# Patient Record
Sex: Female | Born: 1990 | Race: White | Hispanic: No | Marital: Single | Smoking: Former smoker
Health system: Southern US, Community
[De-identification: ages and names within clinical notes are randomized; demographics above are authoritative.]

## PROBLEM LIST (undated history)

## (undated) ENCOUNTER — Inpatient Hospital Stay (HOSPITAL_COMMUNITY): Payer: Self-pay

## (undated) DIAGNOSIS — Z789 Other specified health status: Secondary | ICD-10-CM

## (undated) HISTORY — PX: NO PAST SURGERIES: SHX2092

---
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
## 2009-02-10 ENCOUNTER — Ambulatory Visit (HOSPITAL_COMMUNITY): Admission: RE | Admit: 2009-02-10 | Discharge: 2009-02-10 | Payer: Self-pay | Admitting: Obstetrics

## 2009-06-02 ENCOUNTER — Ambulatory Visit (HOSPITAL_COMMUNITY): Admission: RE | Admit: 2009-06-02 | Discharge: 2009-06-02 | Payer: Self-pay | Admitting: Obstetrics

## 2009-06-23 ENCOUNTER — Inpatient Hospital Stay (HOSPITAL_COMMUNITY): Admission: AD | Admit: 2009-06-23 | Discharge: 2009-06-23 | Payer: Self-pay | Admitting: Obstetrics

## 2009-06-23 ENCOUNTER — Inpatient Hospital Stay (HOSPITAL_COMMUNITY): Admission: AD | Admit: 2009-06-23 | Discharge: 2009-06-25 | Payer: Self-pay | Admitting: Obstetrics

## 2009-06-24 ENCOUNTER — Encounter (INDEPENDENT_AMBULATORY_CARE_PROVIDER_SITE_OTHER): Payer: Self-pay | Admitting: Obstetrics

## 2010-05-15 LAB — CBC
HCT: 34.6 % — ABNORMAL LOW (ref 36.0–46.0)
HCT: 41.5 % (ref 36.0–46.0)
Hemoglobin: 11.8 g/dL — ABNORMAL LOW (ref 12.0–15.0)
Hemoglobin: 14 g/dL (ref 12.0–15.0)
MCHC: 34 g/dL (ref 30.0–36.0)
MCHC: 33.8 g/dL (ref 30.0–36.0)
MCV: 89.9 fL (ref 78.0–100.0)
MCV: 90.6 fL (ref 78.0–100.0)
Platelets: 123 10*3/uL — ABNORMAL LOW (ref 150–400)
Platelets: 127 10*3/uL — ABNORMAL LOW (ref 150–400)
RBC: 3.85 MIL/uL — ABNORMAL LOW (ref 3.87–5.11)
RBC: 4.58 MIL/uL (ref 3.87–5.11)
RDW: 14.5 % (ref 11.5–15.5)
RDW: 13.9 % (ref 11.5–15.5)
WBC: 10 10*3/uL (ref 4.0–10.5)
WBC: 10.5 10*3/uL (ref 4.0–10.5)

## 2010-05-15 LAB — RPR: RPR Ser Ql: NONREACTIVE

## 2010-06-23 ENCOUNTER — Emergency Department (HOSPITAL_BASED_OUTPATIENT_CLINIC_OR_DEPARTMENT_OTHER)
Admission: EM | Admit: 2010-06-23 | Discharge: 2010-06-23 | Disposition: A | Discharge status: Home / Self Care | Payer: Self-pay | Attending: Emergency Medicine | Admitting: Emergency Medicine

## 2010-06-23 DIAGNOSIS — J029 Acute pharyngitis, unspecified: Secondary | ICD-10-CM | POA: Insufficient documentation

## 2010-06-23 LAB — RAPID STREP SCREEN (MED CTR MEBANE ONLY): Streptococcus, Group A Screen (Direct): NEGATIVE

## 2012-03-07 ENCOUNTER — Emergency Department (HOSPITAL_COMMUNITY)
Admission: EM | Admit: 2012-03-07 | Discharge: 2012-03-07 | Disposition: A | Discharge status: Home / Self Care | Payer: Self-pay | Attending: Emergency Medicine | Admitting: Emergency Medicine

## 2012-03-07 ENCOUNTER — Encounter (HOSPITAL_COMMUNITY): Payer: Self-pay | Admitting: Emergency Medicine

## 2012-03-07 DIAGNOSIS — J3489 Other specified disorders of nose and nasal sinuses: Secondary | ICD-10-CM | POA: Insufficient documentation

## 2012-03-07 DIAGNOSIS — Z791 Long term (current) use of non-steroidal anti-inflammatories (NSAID): Secondary | ICD-10-CM | POA: Insufficient documentation

## 2012-03-07 DIAGNOSIS — H65499 Other chronic nonsuppurative otitis media, unspecified ear: Secondary | ICD-10-CM | POA: Insufficient documentation

## 2012-03-07 DIAGNOSIS — F17200 Nicotine dependence, unspecified, uncomplicated: Secondary | ICD-10-CM | POA: Insufficient documentation

## 2012-03-07 DIAGNOSIS — H6590 Unspecified nonsuppurative otitis media, unspecified ear: Secondary | ICD-10-CM

## 2012-03-07 MED ORDER — CETIRIZINE HCL 10 MG PO TABS: 10.0000 mg | ORAL_TABLET | Freq: Every day | ORAL | Status: DC

## 2012-03-07 MED ORDER — NAPROXEN 500 MG PO TABS: 500.0000 mg | ORAL_TABLET | Freq: Two times a day (BID) | ORAL | Status: DC

## 2012-03-07 MED ORDER — KETOROLAC TROMETHAMINE 60 MG/2ML IM SOLN
60.0000 mg | Freq: Once | INTRAMUSCULAR | Status: AC
  Administered 2012-03-07: 60 mg via INTRAMUSCULAR
  Filled 2012-03-07: qty 2

## 2012-03-07 NOTE — ED Notes (Addendum)
PT. REPORTS GENERALIZED BODY ACHES/BACK PAIN WITH CHILLS , OCCASIONAL PRODUCTIVE COUGH AND BILATERAL EAR ACHE FOR SEVERAL DAYS .

## 2012-03-07 NOTE — ED Provider Notes (Signed)
History     CSN: [id number]  Arrival date & time 03/07/12  0040   First MD Initiated Contact with Patient 03/07/12 0114      Chief Complaint  Patient presents with  . Generalized Body Aches    (Consider location/radiation/quality/duration/timing/severity/associated sxs/prior treatment) HPI Comments: 2 months of difficulty hearing - stuffy ears.    CC of body aches, has been present for 4 days, has been intermittent - this evening the sx came back, diffuse body aches - temp of 101.?  Endorses coughing and SOB - worse with moving around.  (waitress for work).  Has some nasal congestion.  No sinus pressure.  Deneis diarrhea, dysuria, swelling or rashes.  N/v last 2 days.  Sx are persistent over time.  Nothing makes better or worse   The history is provided by the patient and a relative.    History reviewed. No pertinent past medical history.  History reviewed. No pertinent past surgical history.  No family history on file.  History  Substance Use Topics  . Smoking status: Current Every Day Smoker  . Smokeless tobacco: Not on file  . Alcohol Use: Yes    OB History    Grav Para Term Preterm Abortions TAB SAB Ect Mult Living                  Review of Systems  All other systems reviewed and are negative.    Allergies  Review of patient's allergies indicates no known allergies.  Home Medications   Current Outpatient Rx  Name  Route  Sig  Dispense  Refill  . DAYQUIL MULTI-SYMPTOM PO   Oral   Take 10 mLs by mouth every 6 (six) hours as needed. For cold symptoms         . CETIRIZINE HCL 10 MG PO TABS   Oral   Take 1 tablet (10 mg total) by mouth daily.   30 tablet   1   . NAPROXEN 500 MG PO TABS   Oral   Take 1 tablet (500 mg total) by mouth 2 (two) times daily with a meal.   30 tablet   0     BP 102/63  Pulse 99  Temp 98.9 F (37.2 C) (Oral)  Resp 14  SpO2 99%  LMP 02/28/2012  Physical Exam  Nursing note and vitals reviewed. Constitutional:  She appears well-developed and well-nourished. No distress.  HENT:  Head: Normocephalic and atraumatic.  Mouth/Throat: Oropharynx is clear and moist. No oropharyngeal exudate.       Bilateral effusions - clear, no erythema, nasal congestion present, OP normal, no pharyngeal erythema.    Eyes: Conjunctivae normal and EOM are normal. Pupils are equal, round, and reactive to light. Right eye exhibits no discharge. Left eye exhibits no discharge. No scleral icterus.  Neck: Normal range of motion. Neck supple. No JVD present. No thyromegaly present.  Cardiovascular: Normal rate, regular rhythm, normal heart sounds and intact distal pulses.  Exam reveals no gallop and no friction rub.   No murmur heard. Pulmonary/Chest: Effort normal and breath sounds normal. No respiratory distress. She has no wheezes. She has no rales.  Abdominal: Soft. Bowel sounds are normal. She exhibits no distension and no mass. There is no tenderness.  Musculoskeletal: Normal range of motion. She exhibits no edema and no tenderness.  Lymphadenopathy:    She has no cervical adenopathy.  Neurological: She is alert. Coordination normal.  Skin: Skin is warm and dry. No rash noted. No erythema.  Psychiatric: She has a normal mood and affect. Her behavior is normal.    ED Course  Procedures (including critical care time)  Labs Reviewed - No data to display No results found.   1. Influenza-like illness   2. Middle ear effusion       MDM  Well appearing, likely URI / Flu like symptoms.  Toradol, home with zyrtec and mobic.  Tolerating fluids without any difficulty, patient appears stable for discharge      Vida Roller, MD 03/07/12 [phone]

## 2012-03-11 ENCOUNTER — Encounter (HOSPITAL_COMMUNITY): Payer: Self-pay | Admitting: *Deleted

## 2012-03-11 ENCOUNTER — Emergency Department (HOSPITAL_COMMUNITY)
Admission: EM | Admit: 2012-03-11 | Discharge: 2012-03-12 | Disposition: A | Discharge status: Home / Self Care | Payer: Self-pay | Attending: Emergency Medicine | Admitting: Emergency Medicine

## 2012-03-11 DIAGNOSIS — F17200 Nicotine dependence, unspecified, uncomplicated: Secondary | ICD-10-CM | POA: Insufficient documentation

## 2012-03-11 DIAGNOSIS — Z202 Contact with and (suspected) exposure to infections with a predominantly sexual mode of transmission: Secondary | ICD-10-CM | POA: Insufficient documentation

## 2012-03-11 DIAGNOSIS — H9190 Unspecified hearing loss, unspecified ear: Secondary | ICD-10-CM | POA: Insufficient documentation

## 2012-03-11 DIAGNOSIS — H9209 Otalgia, unspecified ear: Secondary | ICD-10-CM | POA: Insufficient documentation

## 2012-03-11 DIAGNOSIS — Z79899 Other long term (current) drug therapy: Secondary | ICD-10-CM | POA: Insufficient documentation

## 2012-03-11 DIAGNOSIS — H6690 Otitis media, unspecified, unspecified ear: Secondary | ICD-10-CM | POA: Insufficient documentation

## 2012-03-11 DIAGNOSIS — J3489 Other specified disorders of nose and nasal sinuses: Secondary | ICD-10-CM | POA: Insufficient documentation

## 2012-03-11 DIAGNOSIS — J329 Chronic sinusitis, unspecified: Secondary | ICD-10-CM | POA: Insufficient documentation

## 2012-03-11 NOTE — ED Notes (Signed)
Pt states her boyfriend was dx with clamydia on 02/26/12.  Wants to be treated for that.  Also, c/o ear pain.

## 2012-03-12 LAB — URINALYSIS, ROUTINE W REFLEX MICROSCOPIC
Bilirubin Urine: NEGATIVE
Ketones, ur: 15 mg/dL — AB
Nitrite: NEGATIVE
pH: 6 (ref 5.0–8.0)

## 2012-03-12 LAB — POCT PREGNANCY, URINE: Preg Test, Ur: NEGATIVE

## 2012-03-12 MED ORDER — AZITHROMYCIN 250 MG PO TABS
1000.0000 mg | ORAL_TABLET | Freq: Once | ORAL | Status: AC
  Administered 2012-03-12: 1000 mg via ORAL
  Filled 2012-03-12: qty 4

## 2012-03-12 MED ORDER — METRONIDAZOLE 500 MG PO TABS
2000.0000 mg | ORAL_TABLET | Freq: Once | ORAL | Status: AC
  Administered 2012-03-12: 2000 mg via ORAL
  Filled 2012-03-12: qty 4

## 2012-03-12 MED ORDER — LIDOCAINE HCL (PF) 1 % IJ SOLN
INTRAMUSCULAR | Status: AC
  Filled 2012-03-12: qty 5

## 2012-03-12 MED ORDER — CEFTRIAXONE SODIUM 250 MG IJ SOLR
250.0000 mg | Freq: Once | INTRAMUSCULAR | Status: AC
  Administered 2012-03-12: 250 mg via INTRAMUSCULAR
  Filled 2012-03-12: qty 250

## 2012-03-12 MED ORDER — AMOXICILLIN-POT CLAVULANATE 875-125 MG PO TABS: 1.0000 | ORAL_TABLET | Freq: Two times a day (BID) | ORAL | Status: DC

## 2012-03-12 NOTE — ED Provider Notes (Signed)
Medical screening examination/treatment/procedure(s) were performed by non-physician practitioner and as supervising physician I was immediately available for consultation/collaboration.  Markise Haymer K Clerence Gubser-Rasch, MD 03/12/12 0101 

## 2012-03-12 NOTE — ED Provider Notes (Signed)
History     CSN: [id number]  Arrival date & time 03/11/12  2304   First MD Initiated Contact with Patient 03/12/12 0000      Chief Complaint  Patient presents with  . Exposure to STD  . Otalgia    (Consider location/radiation/quality/duration/timing/severity/associated sxs/prior treatment) HPI Comments: Patient's last menstrual period was 02/28/2012.   Patient is a 22 y.o. female presenting with STD exposure and ear pain. The history is provided by the patient.  Exposure to STD This is a new problem. The current episode started 1 to 4 weeks ago. Episode frequency: denies pain or symptoms. The problem has been unchanged. Pertinent negatives include no abdominal pain, anorexia, coughing, headaches, neck pain, rash or sore throat. Nothing aggravates the symptoms. She has tried nothing for the symptoms.  Otalgia This is a chronic (2 months) problem. There is pain in the right ear. The problem occurs constantly. The problem has been gradually worsening. There has been no fever. The pain is at a severity of 5/10. The pain is moderate. Associated symptoms include hearing loss and rhinorrhea. Pertinent negatives include no ear discharge, no headaches, no sore throat, no abdominal pain, no diarrhea, no neck pain, no cough and no rash.    History reviewed. No pertinent past medical history.  History reviewed. No pertinent past surgical history.  History reviewed. No pertinent family history.  History  Substance Use Topics  . Smoking status: Current Every Day Smoker  . Smokeless tobacco: Not on file  . Alcohol Use: Yes    OB History    Grav Para Term Preterm Abortions TAB SAB Ect Mult Living                  Review of Systems  HENT: Positive for hearing loss, ear pain and rhinorrhea. Negative for sore throat, neck pain and ear discharge.   Respiratory: Negative for cough.   Gastrointestinal: Negative for abdominal pain, diarrhea and anorexia.  Genitourinary: Negative for dysuria,  urgency, frequency, flank pain, decreased urine volume, vaginal bleeding, vaginal discharge, difficulty urinating, genital sores, vaginal pain and dyspareunia.  Skin: Negative for rash.  Neurological: Negative for headaches.  All other systems reviewed and are negative.    Allergies  Review of patient's allergies indicates no known allergies.  Home Medications   Current Outpatient Rx  Name  Route  Sig  Dispense  Refill  . CETIRIZINE HCL 10 MG PO TABS   Oral   Take 1 tablet (10 mg total) by mouth daily.   30 tablet   1   . NAPROXEN 500 MG PO TABS   Oral   Take 1 tablet (500 mg total) by mouth 2 (two) times daily with a meal.   30 tablet   0   . DAYQUIL MULTI-SYMPTOM PO   Oral   Take 10 mLs by mouth every 6 (six) hours as needed. For cold symptoms           BP 121/65  Pulse 72  Temp 97.9 F (36.6 C) (Oral)  Resp 16  SpO2 99%  LMP 02/28/2012  Physical Exam  Constitutional: She is oriented to person, place, and time. She appears well-developed and well-nourished. No distress.  HENT:  Head: Normocephalic and atraumatic.  Mouth/Throat: Oropharynx is clear and moist. No oropharyngeal exudate.       Left ear normal. Right TM bulging, normal external canal. No tragal or mastoid ttp. Nasal congestion and rhinorrhea present. Normal oropharynx w MMM.   Eyes: Conjunctivae normal and  EOM are normal. Pupils are equal, round, and reactive to light. No scleral icterus.  Neck: Normal range of motion. Neck supple. No tracheal deviation present. No thyromegaly present.  Cardiovascular: Normal rate, regular rhythm, normal heart sounds and intact distal pulses.   Pulmonary/Chest: Effort normal and breath sounds normal. No stridor. No respiratory distress. She has no wheezes.  Abdominal: Soft.       Soft non tender abdomen w normal normal sounds  Genitourinary:       Exam deferred   Musculoskeletal: Normal range of motion. She exhibits no edema and no tenderness.  Neurological: She  is alert and oriented to person, place, and time. Coordination normal.  Skin: Skin is warm and dry. No rash noted. She is not diaphoretic. No erythema. No pallor.  Psychiatric: She has a normal mood and affect. Her behavior is normal.    ED Course  Procedures (including critical care time)   Labs Reviewed  URINALYSIS, ROUTINE W REFLEX MICROSCOPIC   No results found.   No diagnosis found.    MDM  Otitis media & STD encounter  Patient to be discharged with instructions to follow up with OBGYN for further STD testing, pelvic exam deferred. Discussed importance of using protection when sexually active. Pt understands that they have been treated prophylacticly with azithromycin and rocephin due to encounter with STD + sexual partner.  Pt not concerning for PID because hemodynamically stable, no abdominal pain on exam or history of dyspareunia or vaginal dc.  Pt has also been treated with flagyl for Bacterial Vaginosis. Pt has been advised to not drink alcohol while on this medication. IN addition she reported that she has had otalgia and sinus infection x 2 months. Pt will be given augmentin x 10 day and advised to f-u with ENT if symptoms persist.          Jaci Carrel, PA-C 03/12/12 [phone]

## 2012-03-12 NOTE — ED Notes (Signed)
Pt given discharge paperwork; pt verbalized understanding of discharge and prescriptions; no additional questions by pt; e-signature obtained; VSS;

## 2015-02-26 NOTE — L&D Delivery Note (Signed)
Delivery Note At 5:39 AM a viable female was delivered via Vaginal, Spontaneous Delivery (Presentation: ; Occiput Anterior).  APGAR: , ; weight  .   Placenta status: , .  Cord: 3 vessels with the following complications: None.  Cord pH: not done  Anesthesia: Epidural  Episiotomy: None Lacerations: None Suture Repair: na Est. Blood Loss (mL): 250  Mom to postpartum.  Baby to Couplet care / Skin to Skin.  MARSHALL,BERNARD A 08/13/2015, 5:54 AM

## 2015-03-02 ENCOUNTER — Emergency Department (HOSPITAL_COMMUNITY)
Admission: EM | Admit: 2015-03-02 | Discharge: 2015-03-03 | Disposition: A | Discharge status: Home / Self Care | Payer: Medicaid Other | Attending: Emergency Medicine | Admitting: Emergency Medicine

## 2015-03-02 ENCOUNTER — Encounter (HOSPITAL_COMMUNITY): Payer: Self-pay | Admitting: *Deleted

## 2015-03-02 DIAGNOSIS — Z880 Allergy status to penicillin: Secondary | ICD-10-CM | POA: Diagnosis not present

## 2015-03-02 DIAGNOSIS — F17200 Nicotine dependence, unspecified, uncomplicated: Secondary | ICD-10-CM | POA: Insufficient documentation

## 2015-03-02 DIAGNOSIS — Z79899 Other long term (current) drug therapy: Secondary | ICD-10-CM | POA: Insufficient documentation

## 2015-03-02 DIAGNOSIS — Z792 Long term (current) use of antibiotics: Secondary | ICD-10-CM | POA: Insufficient documentation

## 2015-03-02 DIAGNOSIS — T5994XA Toxic effect of unspecified gases, fumes and vapors, undetermined, initial encounter: Secondary | ICD-10-CM | POA: Insufficient documentation

## 2015-03-02 DIAGNOSIS — Y990 Civilian activity done for income or pay: Secondary | ICD-10-CM | POA: Diagnosis not present

## 2015-03-02 DIAGNOSIS — X58XXXA Exposure to other specified factors, initial encounter: Secondary | ICD-10-CM | POA: Diagnosis not present

## 2015-03-02 DIAGNOSIS — Z3A16 16 weeks gestation of pregnancy: Secondary | ICD-10-CM | POA: Diagnosis not present

## 2015-03-02 DIAGNOSIS — O219 Vomiting of pregnancy, unspecified: Secondary | ICD-10-CM

## 2015-03-02 DIAGNOSIS — Y9289 Other specified places as the place of occurrence of the external cause: Secondary | ICD-10-CM | POA: Diagnosis not present

## 2015-03-02 DIAGNOSIS — Y9389 Activity, other specified: Secondary | ICD-10-CM | POA: Diagnosis not present

## 2015-03-02 DIAGNOSIS — O210 Mild hyperemesis gravidarum: Secondary | ICD-10-CM | POA: Insufficient documentation

## 2015-03-02 DIAGNOSIS — O9A212 Injury, poisoning and certain other consequences of external causes complicating pregnancy, second trimester: Secondary | ICD-10-CM | POA: Insufficient documentation

## 2015-03-02 DIAGNOSIS — O99332 Smoking (tobacco) complicating pregnancy, second trimester: Secondary | ICD-10-CM | POA: Insufficient documentation

## 2015-03-02 LAB — CBC
HEMATOCRIT: 39.9 % (ref 36.0–46.0)
Hemoglobin: 13.3 g/dL (ref 12.0–15.0)
MCH: 29.2 pg (ref 26.0–34.0)
MCHC: 33.3 g/dL (ref 30.0–36.0)
MCV: 87.7 fL (ref 78.0–100.0)
PLATELETS: 154 10*3/uL (ref 150–400)
RBC: 4.55 MIL/uL (ref 3.87–5.11)
RDW: 13.2 % (ref 11.5–15.5)
WBC: 10.2 10*3/uL (ref 4.0–10.5)

## 2015-03-02 LAB — COMPREHENSIVE METABOLIC PANEL
ALBUMIN: 3.5 g/dL (ref 3.5–5.0)
ALT: 17 U/L (ref 14–54)
AST: 18 U/L (ref 15–41)
Alkaline Phosphatase: 51 U/L (ref 38–126)
Anion gap: 11 (ref 5–15)
BILIRUBIN TOTAL: 0.3 mg/dL (ref 0.3–1.2)
BUN: 12 mg/dL (ref 6–20)
CO2: 23 mmol/L (ref 22–32)
CREATININE: 0.5 mg/dL (ref 0.44–1.00)
Calcium: 9.1 mg/dL (ref 8.9–10.3)
Chloride: 105 mmol/L (ref 101–111)
GFR calc Af Amer: >60 mL/min (ref >60)
GLUCOSE: 88 mg/dL (ref 65–99)
POTASSIUM: 3.7 mmol/L (ref 3.5–5.1)
Sodium: 139 mmol/L (ref 135–145)
TOTAL PROTEIN: 6.8 g/dL (ref 6.5–8.1)

## 2015-03-02 LAB — URINALYSIS, ROUTINE W REFLEX MICROSCOPIC
BILIRUBIN URINE: NEGATIVE
GLUCOSE, UA: NEGATIVE mg/dL
Hgb urine dipstick: NEGATIVE
LEUKOCYTES UA: NEGATIVE
NITRITE: NEGATIVE
PH: 6 (ref 5.0–8.0)
PROTEIN: NEGATIVE mg/dL
Specific Gravity, Urine: 1.028 (ref 1.005–1.030)

## 2015-03-02 LAB — LIPASE, BLOOD: Lipase: 19 U/L (ref 11–51)

## 2015-03-02 MED ORDER — ONDANSETRON 4 MG PO TBDP
4.0000 mg | ORAL_TABLET | Freq: Once | ORAL | Status: AC | PRN
  Administered 2015-03-02: 4 mg via ORAL

## 2015-03-02 MED ORDER — ONDANSETRON HCL 4 MG/2ML IJ SOLN: 4.0000 mg | Freq: Once | INTRAMUSCULAR | Status: DC

## 2015-03-02 MED ORDER — DOXYLAMINE-PYRIDOXINE 10-10 MG PO TBEC: DELAYED_RELEASE_TABLET | ORAL | Status: DC

## 2015-03-02 MED ORDER — SODIUM CHLORIDE 0.9 % IV BOLUS (SEPSIS): 1000.0000 mL | Freq: Once | INTRAVENOUS | Status: DC

## 2015-03-02 MED ORDER — ONDANSETRON 4 MG PO TBDP
ORAL_TABLET | ORAL | Status: AC
  Filled 2015-03-02: qty 1

## 2015-03-02 NOTE — ED Notes (Signed)
Pt states she is [redacted] weeks pregnant and has been vomiting for the past 5 hours.

## 2015-03-02 NOTE — ED Notes (Signed)
Pt states that she took amoxicillin today and then began vomiting. States she has been taking it since new years eve.

## 2015-03-02 NOTE — ED Provider Notes (Signed)
CSN: [id number]     Arrival date & time 03/02/15  2021 History   First MD Initiated Contact with Patient 03/02/15 2259     Chief Complaint  Patient presents with  . Emesis    HPI  [name] is an 25 y.o. G1P0 16-week pregnant female who presents to the ED for evaluation of nausea and vomiting. She states she has felt nauseated with several episodes of NBNB emesis today. She states that earlier in her pregnancy she had issues with nausea and vomiting but that had resolved. 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 She states the only other new exposure she can think of is that she has been taking amoxicillin for an ear infection for the past five days. Pt was given zofran odt in triage and now reports feeling much better. Denies any nausea currently. She is able to tolerate PO. Denies abdominal pain, vaginal discharge, vaginal bleeding. Denies fever, chills, diarrhea. She states her left ear does still hurt a little bit and she feels like she can't hear as well as normal. Last took amoxicillin earlier this morning. Pt states she last saw OB yesterday and had a normal exam.    History reviewed. No pertinent past medical history. History reviewed. No pertinent past surgical history. No family history on file. Social History  Substance Use Topics  . Smoking status: Current Every Day Smoker  . Smokeless tobacco: None  . Alcohol Use: Yes   OB History    Gravida Para Term Preterm AB TAB SAB Ectopic Multiple Living   1              Review of Systems  All other systems reviewed and are negative.     Allergies  Penicillins  Home Medications   Prior to Admission medications   Medication Sig Start Date End Date Taking? Authorizing Provider  amoxicillin (AMOXIL) 875 MG tablet Take 875 mg by mouth 2 (two) times daily.   Yes Historical Provider, MD  BUPRENORPHINE HCL SL Place 20 mg under the  tongue daily.   Yes Historical Provider, MD  Prenatal Vit-Fe Fumarate-FA (PRENATAL MULTIVITAMIN) TABS tablet Take 2 tablets by mouth daily at 12 noon.   Yes Historical Provider, MD  promethazine (PHENERGAN) 25 MG tablet Take 25 mg by mouth every 6 (six) hours as needed for nausea or vomiting.   Yes Historical Provider, MD   BP 121/88 mmHg  Pulse 95  Temp(Src) 97.5 F (36.4 C) (Oral)  Resp 18  SpO2 100% Physical Exam  Constitutional: She is oriented to person, place, and time. No distress.  HENT:  Right Ear: External ear normal.  Left Ear: External ear normal.  Nose: Nose normal.  Mouth/Throat: Oropharynx is clear and moist. No oropharyngeal exudate.  L TM mildly erythematous around the border. No fluid visualized. EAC unremarkable. R TM unremarkable.  Eyes: Conjunctivae and EOM are normal. Pupils are equal, round, and reactive to light.  Neck: Normal range of motion. Neck supple.  Cardiovascular: Normal rate, regular rhythm, normal heart sounds and intact distal pulses.   Pulmonary/Chest: Effort normal and breath sounds normal. No respiratory distress. She exhibits no tenderness.  Abdominal: Soft. Bowel sounds are normal. She exhibits no distension. There is no tenderness. There is no rebound and no guarding.  Musculoskeletal: She exhibits no edema.  Neurological: She is alert and oriented to person, place, and time. No cranial nerve deficit.  Skin: Skin is warm and dry. She is not diaphoretic. No pallor.  Psychiatric: She has a normal mood and affect.  Nursing note and vitals reviewed.     ED Course  Procedures (including critical care time) Labs Review Labs Reviewed  URINALYSIS, ROUTINE W REFLEX MICROSCOPIC (NOT AT [facility]) - Abnormal; Notable for the following:    APPearance CLOUDY (*)    Ketones, ur >80 (*)    All other components within normal limits  LIPASE, BLOOD  COMPREHENSIVE METABOLIC PANEL  CBC    Imaging Review No results found. I have personally reviewed and  evaluated these images and lab results as part of my medical decision-making.   EKG Interpretation None      MDM   Final diagnoses:  Nausea and vomiting in pregnancy prior to [redacted] weeks gestation    Pt is a 16-week pregnant G1P0 female who presents with n/v that started today after exposure to a strong-smelling chemical at work. She is also taking amoxicillin for an ear infection. Her labs are normal. She was given Zofran ODT in triage with relief of her nausea. She is able to tolerate PO. She has had no abdominal pain/cramping, vaginal bleeding, or vaginal discharge. I offered 1L NS bolus but pt declines and would like to go home. Her exam is otherwise unremarkable. VSS. I will give her rx for diclegis (she has phenergan at home from [facility] but states it makes her drowsy) with instructions to f/u with OB outpatient. ER return precautions given. Pt verbalized her understanding.     Carlene Coria, PA-C 03/02/15 2350  ADDENDUM:  Pt also instructed to d/c amoxicillin as her ear infection looks to be resolved, and it is unclear if amoxicillin was contributing to her N/V earlier today.  Carlene Coria, PA-C 03/02/15 2351  Laurence Spates, MD 03/03/15 [phone]

## 2015-03-02 NOTE — Discharge Instructions (Signed)
You were seen in the emergency room today for evaluation of nausea and vomiting. Your symptoms resolved here with a dose of Zofran. Your labs and exam were normal. I will give you a prescription for Diclegis, a nausea medicine that is show to be safe to take during pregnancy. Please call your OB doctor tomorrow to schedule a follow-up appointment. Return to the ER for new or worsening symptoms.

## 2015-03-28 ENCOUNTER — Emergency Department (HOSPITAL_COMMUNITY)
Admission: EM | Admit: 2015-03-28 | Discharge: 2015-03-28 | Disposition: A | Discharge status: Home / Self Care | Payer: Medicaid Other | Attending: Emergency Medicine | Admitting: Emergency Medicine

## 2015-03-28 ENCOUNTER — Encounter (HOSPITAL_COMMUNITY): Payer: Self-pay | Admitting: Emergency Medicine

## 2015-03-28 DIAGNOSIS — O209 Hemorrhage in early pregnancy, unspecified: Secondary | ICD-10-CM | POA: Insufficient documentation

## 2015-03-28 DIAGNOSIS — F17210 Nicotine dependence, cigarettes, uncomplicated: Secondary | ICD-10-CM | POA: Insufficient documentation

## 2015-03-28 DIAGNOSIS — Z79899 Other long term (current) drug therapy: Secondary | ICD-10-CM | POA: Diagnosis not present

## 2015-03-28 DIAGNOSIS — Z792 Long term (current) use of antibiotics: Secondary | ICD-10-CM | POA: Diagnosis not present

## 2015-03-28 DIAGNOSIS — R197 Diarrhea, unspecified: Secondary | ICD-10-CM

## 2015-03-28 DIAGNOSIS — R110 Nausea: Secondary | ICD-10-CM

## 2015-03-28 DIAGNOSIS — O9989 Other specified diseases and conditions complicating pregnancy, childbirth and the puerperium: Secondary | ICD-10-CM | POA: Diagnosis not present

## 2015-03-28 DIAGNOSIS — Z3A20 20 weeks gestation of pregnancy: Secondary | ICD-10-CM | POA: Diagnosis not present

## 2015-03-28 DIAGNOSIS — O99332 Smoking (tobacco) complicating pregnancy, second trimester: Secondary | ICD-10-CM | POA: Insufficient documentation

## 2015-03-28 DIAGNOSIS — Z880 Allergy status to penicillin: Secondary | ICD-10-CM | POA: Insufficient documentation

## 2015-03-28 LAB — CBC WITH DIFFERENTIAL/PLATELET
BASOS ABS: 0 10*3/uL (ref 0.0–0.1)
BASOS PCT: 0 %
EOS ABS: 0.3 10*3/uL (ref 0.0–0.7)
Eosinophils Relative: 4 %
HEMATOCRIT: 38 % (ref 36.0–46.0)
Hemoglobin: 12.5 g/dL (ref 12.0–15.0)
Lymphocytes Relative: 19 %
Lymphs Abs: 1.2 10*3/uL (ref 0.7–4.0)
MCH: 28.8 pg (ref 26.0–34.0)
MCHC: 32.9 g/dL (ref 30.0–36.0)
MCV: 87.6 fL (ref 78.0–100.0)
MONO ABS: 0.5 10*3/uL (ref 0.1–1.0)
Monocytes Relative: 8 %
NEUTROS ABS: 4.5 10*3/uL (ref 1.7–7.7)
Neutrophils Relative %: 69 %
PLATELETS: 155 10*3/uL (ref 150–400)
RBC: 4.34 MIL/uL (ref 3.87–5.11)
RDW: 13.3 % (ref 11.5–15.5)
WBC: 6.5 10*3/uL (ref 4.0–10.5)

## 2015-03-28 LAB — WET PREP, GENITAL
CLUE CELLS WET PREP: NONE SEEN
Sperm: NONE SEEN
TRICH WET PREP: NONE SEEN
Yeast Wet Prep HPF POC: NONE SEEN

## 2015-03-28 LAB — URINALYSIS, ROUTINE W REFLEX MICROSCOPIC
Bilirubin Urine: NEGATIVE
GLUCOSE, UA: NEGATIVE mg/dL
Hgb urine dipstick: NEGATIVE
Ketones, ur: 15 mg/dL — AB
NITRITE: NEGATIVE
PH: 6.5 (ref 5.0–8.0)
Protein, ur: NEGATIVE mg/dL
SPECIFIC GRAVITY, URINE: 1.004 — AB (ref 1.005–1.030)

## 2015-03-28 LAB — COMPREHENSIVE METABOLIC PANEL
ALBUMIN: 2.9 g/dL — AB (ref 3.5–5.0)
ALT: 15 U/L (ref 14–54)
ANION GAP: 10 (ref 5–15)
AST: 14 U/L — AB (ref 15–41)
Alkaline Phosphatase: 47 U/L (ref 38–126)
BILIRUBIN TOTAL: 0.3 mg/dL (ref 0.3–1.2)
BUN: 5 mg/dL — AB (ref 6–20)
CHLORIDE: 108 mmol/L (ref 101–111)
CO2: 22 mmol/L (ref 22–32)
Calcium: 8.6 mg/dL — ABNORMAL LOW (ref 8.9–10.3)
Creatinine, Ser: 0.46 mg/dL (ref 0.44–1.00)
GFR calc Af Amer: >60 mL/min (ref >60)
GFR calc non Af Amer: >60 mL/min (ref >60)
GLUCOSE: 75 mg/dL (ref 65–99)
POTASSIUM: 3.6 mmol/L (ref 3.5–5.1)
SODIUM: 140 mmol/L (ref 135–145)
Total Protein: 5.8 g/dL — ABNORMAL LOW (ref 6.5–8.1)

## 2015-03-28 LAB — URINE MICROSCOPIC-ADD ON

## 2015-03-28 LAB — LIPASE, BLOOD: Lipase: 18 U/L (ref 11–51)

## 2015-03-28 MED ORDER — SODIUM CHLORIDE 0.9 % IV BOLUS (SEPSIS)
1000.0000 mL | Freq: Once | INTRAVENOUS | Status: AC
  Administered 2015-03-28: 1000 mL via INTRAVENOUS

## 2015-03-28 MED ORDER — ACETAMINOPHEN 500 MG PO TABS
1000.0000 mg | ORAL_TABLET | Freq: Once | ORAL | Status: AC
  Administered 2015-03-28: 1000 mg via ORAL
  Filled 2015-03-28: qty 2

## 2015-03-28 MED ORDER — ONDANSETRON HCL 4 MG/2ML IJ SOLN
4.0000 mg | Freq: Once | INTRAMUSCULAR | Status: AC
  Administered 2015-03-28: 4 mg via INTRAVENOUS
  Filled 2015-03-28: qty 2

## 2015-03-28 MED ORDER — DICYCLOMINE HCL 10 MG PO CAPS
20.0000 mg | ORAL_CAPSULE | Freq: Once | ORAL | Status: AC
  Administered 2015-03-28: 20 mg via ORAL
  Filled 2015-03-28: qty 2

## 2015-03-28 NOTE — Discharge Instructions (Signed)
Nausea, Adult [name], see your OB doctor tomorrow during your scheduled appointment.  Your work up here was normal.  For any worsening or concerning symptoms, come back to the ED immediately.  Thank you. Nausea means you feel sick to your stomach or need to throw up (vomit). It may be a sign of a more serious problem. If nausea gets worse, you may throw up. If you throw up a lot, you may lose too much body fluid (dehydration). HOME CARE   Get plenty of rest.  Ask your doctor how to replace body fluid losses (rehydrate).  Eat small amounts of food. Sip liquids more often.  Take all medicines as told by your doctor. GET HELP RIGHT AWAY IF:  You have a fever.  You pass out (faint).  You keep throwing up or have blood in your throw up.  You are very weak, have dry lips or a dry mouth, or you are very thirsty (dehydrated).  You have dark or bloody poop (stool).  You have very bad chest or belly (abdominal) pain.  You do not get better after 2 days, or you get worse.  You have a headache. MAKE SURE YOU:  Understand these instructions.  Will watch your condition.  Will get help right away if you are not doing well or get worse.   This information is not intended to replace advice given to you by your health care provider. Make sure you discuss any questions you have with your health care provider.   Document Released: 01/31/2011 Document Revised: 05/06/2011 Document Reviewed: 01/31/2011 Elsevier Interactive Patient Education [employer]

## 2015-03-28 NOTE — ED Notes (Addendum)
Pt sts [redacted] weeks pregnant and takes subutex; pt sts has not had dose in 4 days; pt sts withdrawal sx with chills, nausea and diarrhea; pt receiving prenatal care; pt G2 P1; pt sts some clear vaginal discharge

## 2015-03-28 NOTE — ED Notes (Signed)
In to evaluate this 25 yo G2P1 @ 20.[redacted] wks GA in with complaint of WDR from Subutex.  Report of feeling fetal movement and denies vaginal bleeding or leaking of fluid.  FHR doppler WDL.

## 2015-03-28 NOTE — ED Provider Notes (Signed)
CSN: [id number]     Arrival date & time 03/28/15  1225 History   First MD Initiated Contact with Patient 03/28/15 1243     Chief Complaint  Patient presents with  . Withdrawal     (Consider location/radiation/quality/duration/timing/severity/associated sxs/prior Treatment) HPI   Gina Hamilton is a 25 y.o. female with no significant past medical history presenting today with which are all symptoms from Subutex. Patient is currently [redacted] weeks pregnant. She states she has not had her medicine in the last 4 days because she is changing care providers. She has an appointment tomorrow to get a med refill, but they instructed her to come to the emergency department for evaluation. She has experienced abdominal cramping, nausea without vomiting, and diarrhea. She states she has also had clear vaginal discharge for the past week. She is still felt the baby move. She denies any vaginal bleeding or large leakage of fluid. She's had no fevers or recent infections. She has no further complaints.  10 Systems reviewed and are negative for acute change except as noted in the HPI.     History reviewed. No pertinent past medical history. History reviewed. No pertinent past surgical history. History reviewed. No pertinent family history. Social History  Substance Use Topics  . Smoking status: Current Every Day Smoker  . Smokeless tobacco: None  . Alcohol Use: No   OB History    Gravida Para Term Preterm AB TAB SAB Ectopic Multiple Living   1              Review of Systems    Allergies  Penicillins  Home Medications   Prior to Admission medications   Medication Sig Start Date End Date Taking? Authorizing Provider  amoxicillin (AMOXIL) 875 MG tablet Take 875 mg by mouth 2 (two) times daily.    Historical Provider, MD  BUPRENORPHINE HCL SL Place 20 mg under the tongue daily.    Historical Provider, MD  Doxylamine-Pyridoxine (DICLEGIS) 10-10 MG TBEC Two tablets at bedtime on day 1 and 2; if  symptoms persist, take 1 tablet in morning and 2 tablets at bedtime on day 3; if symptoms persist, take 1 tablet in morning, 1 tablet afternoon, and 2 tablets at bedtime on day 4. 03/02/15   Carlene Coria, PA-C  Prenatal Vit-Fe Fumarate-FA (PRENATAL MULTIVITAMIN) TABS tablet Take 2 tablets by mouth daily at 12 noon.    Historical Provider, MD  promethazine (PHENERGAN) 25 MG tablet Take 25 mg by mouth every 6 (six) hours as needed for nausea or vomiting.    Historical Provider, MD   BP 99/83 mmHg  Pulse 80  Temp(Src) 98.1 F (36.7 C) (Oral)  Resp 18  SpO2 100% Physical Exam  Constitutional: She is oriented to person, place, and time. She appears well-developed and well-nourished. No distress.  HENT:  Head: Normocephalic and atraumatic.  Nose: Nose normal.  Mouth/Throat: Oropharynx is clear and moist. No oropharyngeal exudate.  Eyes: Conjunctivae and EOM are normal. Pupils are equal, round, and reactive to light. No scleral icterus.  Neck: Normal range of motion. Neck supple. No JVD present. No tracheal deviation present. No thyromegaly present.  Cardiovascular: Normal rate, regular rhythm and normal heart sounds.  Exam reveals no gallop and no friction rub.   No murmur heard. Pulmonary/Chest: Effort normal and breath sounds normal. No respiratory distress. She has no wheezes. She exhibits no tenderness.  Abdominal: Soft. Bowel sounds are normal. She exhibits no distension and no mass. There is no tenderness. There  is no rebound and no guarding.  Gravid uterus  Genitourinary: Uterus normal. Vaginal discharge found.  Mild amount of clear white vaginal discharge seen in the vault. Cervix was high and posterior cannot be assessed. No adnexal tenderness.  Musculoskeletal: Normal range of motion. She exhibits no edema or tenderness.  Lymphadenopathy:    She has no cervical adenopathy.  Neurological: She is alert and oriented to person, place, and time. No cranial nerve deficit. She exhibits normal  muscle tone.  Skin: Skin is warm and dry. No rash noted. No erythema. No pallor.  Nursing note and vitals reviewed.   ED Course  Procedures (including critical care time) Labs Review Labs Reviewed  WET PREP, GENITAL - Abnormal; Notable for the following:    WBC, Wet Prep HPF POC FEW (*)    All other components within normal limits  COMPREHENSIVE METABOLIC PANEL - Abnormal; Notable for the following:    BUN 5 (*)    Calcium 8.6 (*)    Total Protein 5.8 (*)    Albumin 2.9 (*)    AST 14 (*)    All other components within normal limits  URINE CULTURE  CBC WITH DIFFERENTIAL/PLATELET  LIPASE, BLOOD  URINALYSIS, ROUTINE W REFLEX MICROSCOPIC (NOT AT [facility])  GC/CHLAMYDIA PROBE AMP ([place]) NOT AT [facility]    Imaging Review No results found. I have personally reviewed and evaluated these images and lab results as part of my medical decision-making.   EKG Interpretation None      MDM   Final diagnoses:  None    Patient presents emergency department because she is out of her Subutex. She was advised that we will not give her any opioid prescriptions. She was given IV fluids, Zofran, Bentyl for relief. She has follow-up tomorrow, she was advised to attend this appointment. Tylenol also given for abdominal cramping. Fetal heart tones were 137 and appropriate. Wet prep does not show any acute infections. She appears well-developed and no acute distress, vital signs were within her normal limits and she is safe for discharge.  Tomasita Crumble, MD 03/28/15 1426

## 2015-03-28 NOTE — Discharge Planning (Signed)
ERCM consulted to provide medication assistance.  Pt on Medicaid; not eligible for MATCH or any medication assistance.  ERCM spoke to pt about switching medications, pt states she is able to afford and will have Rx filled at [facility].

## 2015-03-29 LAB — URINE CULTURE

## 2015-03-29 LAB — GC/CHLAMYDIA PROBE AMP (~~LOC~~) NOT AT ARMC
CHLAMYDIA, DNA PROBE: NEGATIVE
NEISSERIA GONORRHEA: NEGATIVE

## 2015-05-12 ENCOUNTER — Encounter (HOSPITAL_COMMUNITY): Payer: Self-pay | Admitting: *Deleted

## 2015-05-12 ENCOUNTER — Inpatient Hospital Stay (HOSPITAL_COMMUNITY)
Admission: AD | Admit: 2015-05-12 | Discharge: 2015-05-12 | Disposition: A | Discharge status: Home / Self Care | Payer: Medicaid Other | Source: Ambulatory Visit | Attending: Obstetrics | Admitting: Obstetrics

## 2015-05-12 DIAGNOSIS — R109 Unspecified abdominal pain: Secondary | ICD-10-CM | POA: Diagnosis not present

## 2015-05-12 DIAGNOSIS — Z880 Allergy status to penicillin: Secondary | ICD-10-CM | POA: Insufficient documentation

## 2015-05-12 DIAGNOSIS — O9989 Other specified diseases and conditions complicating pregnancy, childbirth and the puerperium: Secondary | ICD-10-CM

## 2015-05-12 DIAGNOSIS — Z3A26 26 weeks gestation of pregnancy: Secondary | ICD-10-CM | POA: Diagnosis not present

## 2015-05-12 DIAGNOSIS — N898 Other specified noninflammatory disorders of vagina: Secondary | ICD-10-CM | POA: Diagnosis not present

## 2015-05-12 DIAGNOSIS — Z87891 Personal history of nicotine dependence: Secondary | ICD-10-CM | POA: Diagnosis not present

## 2015-05-12 DIAGNOSIS — O26899 Other specified pregnancy related conditions, unspecified trimester: Secondary | ICD-10-CM

## 2015-05-12 DIAGNOSIS — O26892 Other specified pregnancy related conditions, second trimester: Secondary | ICD-10-CM | POA: Insufficient documentation

## 2015-05-12 HISTORY — DX: Other specified health status: Z78.9

## 2015-05-12 LAB — URINALYSIS, ROUTINE W REFLEX MICROSCOPIC
Bilirubin Urine: NEGATIVE
Glucose, UA: NEGATIVE mg/dL
Hgb urine dipstick: NEGATIVE
Ketones, ur: 15 mg/dL — AB
Leukocytes, UA: NEGATIVE
Nitrite: NEGATIVE
Protein, ur: NEGATIVE mg/dL
Specific Gravity, Urine: 1.025 (ref 1.005–1.030)
pH: 6 (ref 5.0–8.0)

## 2015-05-12 LAB — WET PREP, GENITAL
Clue Cells Wet Prep HPF POC: NONE SEEN
SPERM: NONE SEEN
TRICH WET PREP: NONE SEEN
Yeast Wet Prep HPF POC: NONE SEEN

## 2015-05-12 LAB — FETAL FIBRONECTIN: Fetal Fibronectin: NEGATIVE

## 2015-05-12 NOTE — MAU Provider Note (Signed)
History     CSN: [id number]  Arrival date and time: 05/12/15 1409   First Provider Initiated Contact with Patient 05/12/15 1705      Chief Complaint  Patient presents with  . Abdominal Pain   HPI  Gina Hamilton is a 25 y.o. G3P0011 at 2045w6d who presents with abdominal cramping.  Lower abdominal cramping x 3 days. No change today. Did not call Dr. Gaynell FaceMarshall. Pain worse with walking. Reports some vaginal discharge yesterday. Denies LOF or vaginal bleeding. Positive fetal movement.  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  Last intercourse 4 days ago.  Denies n/v/d, fever, or urinary complaints. Some constipation. Last BM today was normal for her.    OB History    Gravida Para Term Preterm AB TAB SAB Ectopic Multiple Living   3 1   1  1   1       Past Medical History  Diagnosis Date  . Medical history non-contributory     Past Surgical History  Procedure Laterality Date  . No past surgeries      History reviewed. No pertinent family history.  Social History  Substance Use Topics  . Smoking status: Former Games developermoker  . Smokeless tobacco: None  . Alcohol Use: No    Allergies:  Allergies  Allergen Reactions  . Penicillins Other (See Comments)    Childhood allergy  Has patient had a PCN reaction causing immediate rash, facial/tongue/throat swelling, SOB or lightheadedness with hypotension: Yes Has patient had a PCN reaction causing severe rash involving mucus membranes or skin necrosis: No Has patient had a PCN reaction that required hospitalization No Has patient had a PCN reaction occurring within the last 10 years: No If all of the above answers are "NO", then may proceed with Cephalosporin use.     Prescriptions prior to admission  Medication Sig Dispense Refill Last Dose  . ferrous sulfate 325 (65 FE) MG tablet Take 325 mg by mouth daily.  6 Past Week at Unknown time  . methadone (DOLOPHINE) 10 MG/ML solution Take 65 mg by mouth daily.   05/11/2015 at  Unknown time  . Prenatal Vit-Fe Fumarate-FA (PRENATAL MULTIVITAMIN) TABS tablet Take 2 tablets by mouth daily at 12 noon.   05/11/2015 at Unknown time    Review of Systems  Constitutional: Negative.   Gastrointestinal: Positive for abdominal pain and constipation. Negative for nausea, vomiting and diarrhea.  Genitourinary: Negative for dysuria.       + vaginal discharge   Physical Exam   Blood pressure 124/60, pulse 81, temperature 98.3 F (36.8 C), temperature source Oral, resp. rate 18, height 5' 6.5" (1.689 m), weight 196 lb 8 oz (89.132 kg).  Physical Exam  Nursing note and vitals reviewed. Constitutional: She is oriented to person, place, and time. She appears well-developed and well-nourished. No distress.  HENT:  Head: Normocephalic and atraumatic.  Eyes: Conjunctivae are normal. Right eye exhibits no discharge. Left eye exhibits no discharge. No scleral icterus.  Neck: Normal range of motion.  Cardiovascular: Normal rate, regular rhythm and normal heart sounds.   No murmur heard. Respiratory: Effort normal and breath sounds normal. No respiratory distress. She has no wheezes.  GI: Soft. There is no tenderness. There is no rebound.  Genitourinary: Cervix exhibits no motion tenderness and no friability. No bleeding in the vagina. Vaginal discharge (small amount of creamy yellow frothy discharge) found.  Neurological: She is alert and oriented to person, place, and time.  Skin: Skin is warm and  dry. She is not diaphoretic.  Psychiatric: She has a normal mood and affect. Her behavior is normal. Judgment and thought content normal.   Dilation: Fingertip Effacement (%): 20 Cervical Position: Posterior Station: -3 Exam by:: Estanislado Spire NP  Fetal Tracing:  Baseline: 130 Variability: moderate Accelerations: 10x10 Decelerations: none  Toco: none   MAU Course  Procedures Results for orders placed or performed during the hospital encounter of 05/12/15 (from the past 24  hour(s))  Urinalysis, Routine w reflex microscopic (not at [facility])     Status: Abnormal   Collection Time: 05/12/15  2:20 PM  Result Value Ref Range   Color, Urine YELLOW YELLOW   APPearance HAZY (A) CLEAR   Specific Gravity, Urine 1.025 1.005 - 1.030   pH 6.0 5.0 - 8.0   Glucose, UA NEGATIVE NEGATIVE mg/dL   Hgb urine dipstick NEGATIVE NEGATIVE   Bilirubin Urine NEGATIVE NEGATIVE   Ketones, ur 15 (A) NEGATIVE mg/dL   Protein, ur NEGATIVE NEGATIVE mg/dL   Nitrite NEGATIVE NEGATIVE   Leukocytes, UA NEGATIVE NEGATIVE  Fetal fibronectin     Status: None   Collection Time: 05/12/15  5:35 PM  Result Value Ref Range   Fetal Fibronectin NEGATIVE NEGATIVE  Wet prep, genital     Status: Abnormal   Collection Time: 05/12/15  5:35 PM  Result Value Ref Range   Yeast Wet Prep HPF POC NONE SEEN NONE SEEN   Trich, Wet Prep NONE SEEN NONE SEEN   Clue Cells Wet Prep HPF POC NONE SEEN NONE SEEN   WBC, Wet Prep HPF POC MANY (A) NONE SEEN   Sperm NONE SEEN     MDM Category 1 tracing No contractions on monitor Wet prep & FFN FFN negative S/w Dr. Clearance Coots about presentation, SVE, FHT, labs. Ok to discharge home.  Assessment and Plan  A: 1. Abdominal pain affecting pregnancy   2. Vaginal discharge during pregnancy in second trimester     P: Discharge home Increase water intake Maternity support belt & slow position changes Preterm labor precautions  Judeth Horn 05/12/2015, 5:05 PM

## 2015-05-12 NOTE — Discharge Instructions (Signed)

## 2015-05-12 NOTE — MAU Note (Signed)
Pt C/O lower abd cramping & sharp pain that started 2 days ago, had brownish pink discharge yesterday - none today.

## 2015-05-12 NOTE — Progress Notes (Signed)
Patient c/o brownish vaginal discharge, patient on Methadone did not go get dose today.

## 2015-05-12 NOTE — MAU Note (Signed)
Patient presents with cramping x 2 to 3 days, has increased in intensity, .

## 2015-07-18 ENCOUNTER — Encounter (INDEPENDENT_AMBULATORY_CARE_PROVIDER_SITE_OTHER): Payer: Self-pay

## 2015-07-18 ENCOUNTER — Ambulatory Visit (HOSPITAL_COMMUNITY)
Admission: RE | Admit: 2015-07-18 | Discharge: 2015-07-18 | Disposition: A | Discharge status: Home / Self Care | Payer: Medicaid Other | Source: Ambulatory Visit | Attending: Obstetrics | Admitting: Obstetrics

## 2015-07-18 ENCOUNTER — Other Ambulatory Visit (HOSPITAL_COMMUNITY): Payer: Self-pay | Admitting: Obstetrics

## 2015-07-18 DIAGNOSIS — O26893 Other specified pregnancy related conditions, third trimester: Secondary | ICD-10-CM | POA: Insufficient documentation

## 2015-07-18 DIAGNOSIS — R05 Cough: Secondary | ICD-10-CM

## 2015-07-18 DIAGNOSIS — R079 Chest pain, unspecified: Secondary | ICD-10-CM | POA: Insufficient documentation

## 2015-07-18 DIAGNOSIS — Z3A36 36 weeks gestation of pregnancy: Secondary | ICD-10-CM | POA: Diagnosis not present

## 2015-07-18 DIAGNOSIS — R059 Cough, unspecified: Secondary | ICD-10-CM

## 2015-07-31 ENCOUNTER — Ambulatory Visit (HOSPITAL_COMMUNITY)
Admission: RE | Admit: 2015-07-31 | Discharge: 2015-07-31 | Disposition: A | Discharge status: Home / Self Care | Payer: Medicaid Other | Source: Ambulatory Visit | Attending: Obstetrics | Admitting: Obstetrics

## 2015-07-31 ENCOUNTER — Other Ambulatory Visit (HOSPITAL_COMMUNITY): Payer: Self-pay | Admitting: Obstetrics

## 2015-07-31 DIAGNOSIS — R52 Pain, unspecified: Secondary | ICD-10-CM

## 2015-08-13 ENCOUNTER — Inpatient Hospital Stay (HOSPITAL_COMMUNITY): Payer: Medicaid Other | Admitting: Anesthesiology

## 2015-08-13 ENCOUNTER — Inpatient Hospital Stay (HOSPITAL_COMMUNITY)
Admission: AD | Admit: 2015-08-13 | Discharge: 2015-08-15 | DRG: 775 | Disposition: A | Discharge status: Home / Self Care | Payer: Medicaid Other | Source: Ambulatory Visit | Attending: Obstetrics | Admitting: Obstetrics

## 2015-08-13 ENCOUNTER — Encounter (HOSPITAL_COMMUNITY): Payer: Self-pay | Admitting: *Deleted

## 2015-08-13 DIAGNOSIS — Z3A40 40 weeks gestation of pregnancy: Secondary | ICD-10-CM | POA: Diagnosis not present

## 2015-08-13 DIAGNOSIS — Z23 Encounter for immunization: Secondary | ICD-10-CM | POA: Diagnosis not present

## 2015-08-13 DIAGNOSIS — IMO0001 Reserved for inherently not codable concepts without codable children: Secondary | ICD-10-CM

## 2015-08-13 LAB — CBC
HEMATOCRIT: 35.9 % — AB (ref 36.0–46.0)
HEMOGLOBIN: 12.1 g/dL (ref 12.0–15.0)
MCH: 28.7 pg (ref 26.0–34.0)
MCHC: 33.7 g/dL (ref 30.0–36.0)
MCV: 85.3 fL (ref 78.0–100.0)
Platelets: 198 10*3/uL (ref 150–400)
RBC: 4.21 MIL/uL (ref 3.87–5.11)
RDW: 14.4 % (ref 11.5–15.5)
WBC: 9.5 10*3/uL (ref 4.0–10.5)

## 2015-08-13 LAB — RPR: RPR Ser Ql: NONREACTIVE

## 2015-08-13 LAB — TYPE AND SCREEN
ABO/RH(D): O POS
Antibody Screen: NEGATIVE

## 2015-08-13 LAB — ABO/RH: ABO/RH(D): O POS

## 2015-08-13 MED ORDER — FERROUS SULFATE 325 (65 FE) MG PO TABS
325.0000 mg | ORAL_TABLET | Freq: Two times a day (BID) | ORAL | Status: DC
  Administered 2015-08-13 – 2015-08-15 (×5): 325 mg via ORAL
  Filled 2015-08-13 (×5): qty 1

## 2015-08-13 MED ORDER — METHADONE HCL 10 MG/ML PO CONC
95.0000 mg | Freq: Every day | ORAL | Status: DC
  Administered 2015-08-13 – 2015-08-15 (×3): 95 mg via ORAL
  Filled 2015-08-13 (×5): qty 9.5

## 2015-08-13 MED ORDER — BUTORPHANOL TARTRATE 1 MG/ML IJ SOLN: 1.0000 mg | INTRAMUSCULAR | Status: DC | PRN

## 2015-08-13 MED ORDER — ONDANSETRON HCL 4 MG/2ML IJ SOLN: 4.0000 mg | INTRAMUSCULAR | Status: DC | PRN

## 2015-08-13 MED ORDER — WITCH HAZEL-GLYCERIN EX PADS: 1.0000 "application " | MEDICATED_PAD | CUTANEOUS | Status: DC | PRN

## 2015-08-13 MED ORDER — LIDOCAINE HCL (PF) 1 % IJ SOLN
INTRAMUSCULAR | Status: DC | PRN
  Administered 2015-08-13 (×2): 6 mL

## 2015-08-13 MED ORDER — ZOLPIDEM TARTRATE 5 MG PO TABS: 5.0000 mg | ORAL_TABLET | Freq: Every evening | ORAL | Status: DC | PRN

## 2015-08-13 MED ORDER — ACETAMINOPHEN 325 MG PO TABS: 650.0000 mg | ORAL_TABLET | ORAL | Status: DC | PRN

## 2015-08-13 MED ORDER — IBUPROFEN 600 MG PO TABS
600.0000 mg | ORAL_TABLET | Freq: Four times a day (QID) | ORAL | Status: DC
  Administered 2015-08-13 – 2015-08-15 (×9): 600 mg via ORAL
  Filled 2015-08-13 (×10): qty 1

## 2015-08-13 MED ORDER — FLEET ENEMA 7-19 GM/118ML RE ENEM: 1.0000 | ENEMA | RECTAL | Status: DC | PRN

## 2015-08-13 MED ORDER — SENNOSIDES-DOCUSATE SODIUM 8.6-50 MG PO TABS
2.0000 | ORAL_TABLET | ORAL | Status: DC
  Administered 2015-08-14 (×2): 2 via ORAL
  Filled 2015-08-13 (×2): qty 2

## 2015-08-13 MED ORDER — PHENYLEPHRINE 40 MCG/ML (10ML) SYRINGE FOR IV PUSH (FOR BLOOD PRESSURE SUPPORT)
80.0000 ug | PREFILLED_SYRINGE | INTRAVENOUS | Status: DC | PRN
Start: 2015-08-13 — End: 2015-08-13
  Filled 2015-08-13: qty 5
  Filled 2015-08-13: qty 10

## 2015-08-13 MED ORDER — SOD CITRATE-CITRIC ACID 500-334 MG/5ML PO SOLN
30.0000 mL | ORAL | Status: DC | PRN
  Administered 2015-08-13: 30 mL via ORAL
  Filled 2015-08-13: qty 15

## 2015-08-13 MED ORDER — ONDANSETRON HCL 4 MG PO TABS: 4.0000 mg | ORAL_TABLET | ORAL | Status: DC | PRN

## 2015-08-13 MED ORDER — EPHEDRINE 5 MG/ML INJ
10.0000 mg | INTRAVENOUS | Status: DC | PRN
  Filled 2015-08-13: qty 2

## 2015-08-13 MED ORDER — PHENYLEPHRINE 40 MCG/ML (10ML) SYRINGE FOR IV PUSH (FOR BLOOD PRESSURE SUPPORT)
80.0000 ug | PREFILLED_SYRINGE | INTRAVENOUS | Status: DC | PRN
  Filled 2015-08-13: qty 5

## 2015-08-13 MED ORDER — DIPHENHYDRAMINE HCL 25 MG PO CAPS: 25.0000 mg | ORAL_CAPSULE | Freq: Four times a day (QID) | ORAL | Status: DC | PRN

## 2015-08-13 MED ORDER — COCONUT OIL OIL
1.0000 "application " | TOPICAL_OIL | Status: DC | PRN
  Filled 2015-08-13: qty 120

## 2015-08-13 MED ORDER — SIMETHICONE 80 MG PO CHEW: 80.0000 mg | CHEWABLE_TABLET | ORAL | Status: DC | PRN

## 2015-08-13 MED ORDER — OXYCODONE-ACETAMINOPHEN 5-325 MG PO TABS: 2.0000 | ORAL_TABLET | ORAL | Status: DC | PRN

## 2015-08-13 MED ORDER — OXYTOCIN 40 UNITS IN LACTATED RINGERS INFUSION - SIMPLE MED
2.5000 [IU]/h | INTRAVENOUS | Status: DC
  Filled 2015-08-13: qty 1000

## 2015-08-13 MED ORDER — PRENATAL MULTIVITAMIN CH
1.0000 | ORAL_TABLET | Freq: Every day | ORAL | Status: DC
  Administered 2015-08-13 – 2015-08-15 (×3): 1 via ORAL
  Filled 2015-08-13 (×3): qty 1

## 2015-08-13 MED ORDER — OXYTOCIN BOLUS FROM INFUSION
500.0000 mL | INTRAVENOUS | Status: DC
  Administered 2015-08-13: 500 mL via INTRAVENOUS

## 2015-08-13 MED ORDER — LACTATED RINGERS IV SOLN: 500.0000 mL | INTRAVENOUS | Status: DC | PRN

## 2015-08-13 MED ORDER — FENTANYL 2.5 MCG/ML BUPIVACAINE 1/10 % EPIDURAL INFUSION (WH - ANES)
14.0000 mL/h | INTRAMUSCULAR | Status: DC | PRN
  Administered 2015-08-13: 14 mL/h via EPIDURAL
  Filled 2015-08-13: qty 125

## 2015-08-13 MED ORDER — BENZOCAINE-MENTHOL 20-0.5 % EX AERO
1.0000 "application " | INHALATION_SPRAY | CUTANEOUS | Status: DC | PRN
  Administered 2015-08-13 – 2015-08-15 (×2): 1 via TOPICAL
  Filled 2015-08-13 (×2): qty 56

## 2015-08-13 MED ORDER — LIDOCAINE HCL (PF) 1 % IJ SOLN
30.0000 mL | INTRAMUSCULAR | Status: DC | PRN
  Filled 2015-08-13: qty 30

## 2015-08-13 MED ORDER — OXYCODONE-ACETAMINOPHEN 5-325 MG PO TABS: 1.0000 | ORAL_TABLET | ORAL | Status: DC | PRN

## 2015-08-13 MED ORDER — ACETAMINOPHEN 325 MG PO TABS
650.0000 mg | ORAL_TABLET | ORAL | Status: DC | PRN
  Administered 2015-08-13 – 2015-08-14 (×3): 650 mg via ORAL
  Filled 2015-08-13 (×3): qty 2

## 2015-08-13 MED ORDER — DIPHENHYDRAMINE HCL 50 MG/ML IJ SOLN: 12.5000 mg | INTRAMUSCULAR | Status: DC | PRN

## 2015-08-13 MED ORDER — ONDANSETRON HCL 4 MG/2ML IJ SOLN
4.0000 mg | Freq: Four times a day (QID) | INTRAMUSCULAR | Status: DC | PRN
  Administered 2015-08-13: 4 mg via INTRAVENOUS
  Filled 2015-08-13: qty 2

## 2015-08-13 MED ORDER — EPHEDRINE 5 MG/ML INJ
10.0000 mg | INTRAVENOUS | Status: DC | PRN
Start: 2015-08-13 — End: 2015-08-13
  Filled 2015-08-13: qty 2

## 2015-08-13 MED ORDER — LACTATED RINGERS IV SOLN
INTRAVENOUS | Status: DC
  Administered 2015-08-13 (×2): via INTRAVENOUS

## 2015-08-13 MED ORDER — TETANUS-DIPHTH-ACELL PERTUSSIS 5-2.5-18.5 LF-MCG/0.5 IM SUSP
0.5000 mL | Freq: Once | INTRAMUSCULAR | Status: AC
  Administered 2015-08-14: 0.5 mL via INTRAMUSCULAR
  Filled 2015-08-13: qty 0.5

## 2015-08-13 MED ORDER — LACTATED RINGERS IV SOLN: 500.0000 mL | Freq: Once | INTRAVENOUS | Status: DC

## 2015-08-13 MED ORDER — DIBUCAINE 1 % RE OINT: 1.0000 "application " | TOPICAL_OINTMENT | RECTAL | Status: DC | PRN

## 2015-08-13 MED ORDER — LACTATED RINGERS IV SOLN
500.0000 mL | Freq: Once | INTRAVENOUS | Status: AC
  Administered 2015-08-13: 02:00:00 via INTRAVENOUS

## 2015-08-13 NOTE — Progress Notes (Signed)
Pt. States that she tripped and fell last week and injured the skin on the anterior RLE. There is a shallow puncture wound on the anterior ankle. There is a 3 cms laceration below the right knee and a number of superficial linear abraison on the right anterior leg. All wounds are clean and drying and healing well.

## 2015-08-13 NOTE — Anesthesia Preprocedure Evaluation (Signed)
Anesthesia Evaluation  Patient identified by MRN, date of birth, ID band Patient awake    Reviewed: Allergy & Precautions, NPO status , Patient's Chart, lab work & pertinent test results  Airway Mallampati: II  TM Distance: >3 FB Neck ROM: Full    Dental no notable dental hx.    Pulmonary former smoker,    Pulmonary exam normal breath sounds clear to auscultation       Cardiovascular negative cardio ROS Normal cardiovascular exam Rhythm:Regular Rate:Normal     Neuro/Psych negative neurological ROS  negative psych ROS   GI/Hepatic negative GI ROS, Neg liver ROS,   Endo/Other  negative endocrine ROS  Renal/GU negative Renal ROS  negative genitourinary   Musculoskeletal negative musculoskeletal ROS (+)   Abdominal   Peds negative pediatric ROS (+)  Hematology negative hematology ROS (+)   Anesthesia Other Findings   Reproductive/Obstetrics negative OB ROS                            Anesthesia Physical Anesthesia Plan  ASA: II  Anesthesia Plan: Epidural   Post-op Pain Management:    Induction: Intravenous  Airway Management Planned: Natural Airway  Additional Equipment:   Intra-op Plan:   Post-operative Plan:   Informed Consent: I have reviewed the patients History and Physical, chart, labs and discussed the procedure including the risks, benefits and alternatives for the proposed anesthesia with the patient or authorized representative who has indicated his/her understanding and acceptance.   Dental advisory given  Plan Discussed with: CRNA  Anesthesia Plan Comments: (Informed consent obtained prior to proceeding including risk of failure, 1% risk of PDPH, risk of minor discomfort and bruising.  Discussed rare but serious complications including epidural abscess, permanent nerve injury, epidural hematoma.  Discussed alternatives to epidural analgesia and patient desires to  proceed.  Timeout performed pre-procedure verifying patient name, procedure, and platelet count.  Patient tolerated procedure well. )      Anesthesia Quick Evaluation  

## 2015-08-13 NOTE — Lactation Note (Signed)
This note was copied from a baby's chart. Lactation Consultation Note  P2, Breastfed first child for 3 weeks but states she would like to breastfeed this child longer. Suggest mother call for assistance w/ next feeding. Discussed supply and demand and recommend breastfeeding before offering formula. Mother states she knows how to hand express and has viewed drops. Ped MD entered during consult. Provided mother with manual pump. Mom encouraged to feed baby 8-12 times/24 hours and with feeding cues.  Mom made aware of O/P services, breastfeeding support groups, community resources, and our phone # for post-discharge questions.    Patient Name: Gina Duwaine Maxinshley Conigliaro ZDGUY'QToday's Date: 08/13/2015 Reason for consult: Initial assessment   Maternal Data Has patient been taught Hand Expression?: Yes Does the patient have breastfeeding experience prior to this delivery?: Yes  Feeding Feeding Type: Breast Fed Length of feed: 15 min  LATCH Score/Interventions Latch: Grasps breast easily, tongue down, lips flanged, rhythmical sucking. Intervention(s): Assist with latch;Adjust position  Audible Swallowing: Spontaneous and intermittent Intervention(s): Skin to skin;Hand expression  Type of Nipple: Everted at rest and after stimulation  Comfort (Breast/Nipple): Soft / non-tender     Hold (Positioning): Assistance needed to correctly position infant at breast and maintain latch.  LATCH Score: 9  Lactation Tools Discussed/Used     Consult Status Consult Status: Follow-up Date: 08/14/15 Follow-up type: In-patient    Dahlia ByesBerkelhammer, Ruth [facility] 08/13/2015, 2:12 PM

## 2015-08-13 NOTE — Anesthesia Postprocedure Evaluation (Signed)
Anesthesia Post Note  Patient: Gina Hamilton  Procedure(s) Performed: * No procedures listed *  Patient location during evaluation: Mother Baby Anesthesia Type: Epidural Level of consciousness: awake Pain management: satisfactory to patient Vital Signs Assessment: post-procedure vital signs reviewed and stable Respiratory status: spontaneous breathing Cardiovascular status: stable Anesthetic complications: no     Last Vitals:  Filed Vitals:   08/13/15 0934 08/13/15 1305  BP: 121/71 129/73  Pulse: 64 82  Temp: 36.9 C 37 C  Resp: 18 18    Last Pain:  Filed Vitals:   08/13/15 1312  PainSc: 0-No pain   Pain Goal:                 KeyCorpBURGER,Corrine Tillis

## 2015-08-13 NOTE — Anesthesia Pain Management Evaluation Note (Signed)
  CRNA Pain Management Visit Note  Patient: Gina Hamilton, 25 y.o., female  "Hello I am a [facility] of the anesthesia [facility]. We have an anesthesia team available at all times to provide care throughout the hospital, including epidural management and anesthesia for C-section. I don't know your plan for the delivery whether it a natural birth, water birth, IV sedation, nitrous supplementation, doula or epidural, but we want to meet your pain goals."   1.Was your pain managed to your expectations on prior hospitalizations?   Yes   2.What is your expectation for pain management during this hospitalization?     Epidural  3.How can we help you reach that goal? MDA notified patient ready for epidural  Record the patient's initial score and the patient's pain goal.   Pain: 10  Pain Goal: 3 [facility] wants you to be able to say your pain was always managed very well.  Asjah Rauda 08/13/2015

## 2015-08-13 NOTE — Anesthesia Procedure Notes (Signed)
Epidural Patient location during procedure: OB  Staffing Anesthesiologist: Mate Alegria  Preanesthetic Checklist Completed: patient identified, site marked, surgical consent, pre-op evaluation, timeout performed, IV checked, risks and benefits discussed and monitors and equipment checked  Epidural Patient position: sitting Prep: DuraPrep Patient monitoring: heart rate and blood pressure Approach: midline Location: L3-L4 Injection technique: LOR saline  Needle:  Needle type: Tuohy  Needle gauge: 17 G Needle length: 9 cm Needle insertion depth: 6 cm Catheter type: closed end flexible Catheter size: 19 Gauge Catheter at skin depth: 13 cm Test dose: negative and Other  Assessment Events: blood not aspirated, injection not painful, no injection resistance, negative IV test and no paresthesia  Additional Notes Reason for block:procedure for pain   

## 2015-08-13 NOTE — H&P (Signed)
This is Dr. Francoise CeoBernard Marshall dictating the history and physical on  Gina Hamilton she's a 25 year old female gravida 3 para 1011 EDC 08/12/1738 weeks and 1 day negative GBS admitted in labor she is now 9 cm 100% amniotomy performed fluids clear she has an epidural in labor Past medical history negative Past surgical history negative Social history negative System review negative Physical exam well-developed female in labor HEENT negative Lungs clear to P&A Heart regular rhythm no murmurs no gallops Breasts negative Abdomen term Pelvic as described above Extremities negative

## 2015-08-13 NOTE — MAU Note (Signed)
Pt reports ctx q 5-6 min for the past few hours. reprots loosing her mucus plug. Good fetal movement reported.

## 2015-08-14 LAB — CBC
HCT: 32.5 % — ABNORMAL LOW (ref 36.0–46.0)
HEMOGLOBIN: 10.4 g/dL — AB (ref 12.0–15.0)
MCH: 28.3 pg (ref 26.0–34.0)
MCHC: 32 g/dL (ref 30.0–36.0)
MCV: 88.3 fL (ref 78.0–100.0)
Platelets: 137 10*3/uL — ABNORMAL LOW (ref 150–400)
RBC: 3.68 MIL/uL — ABNORMAL LOW (ref 3.87–5.11)
RDW: 14.5 % (ref 11.5–15.5)
WBC: 7.1 10*3/uL (ref 4.0–10.5)

## 2015-08-14 NOTE — Lactation Note (Signed)
This note was copied from a baby's chart. Lactation Consultation Note Follow up visit at 38 hours of age.  Baby is breast and bottle feeding per moms choice.  Mom is taking methadone and baby is using a pacifier.  Mom indicated a little nipple soreness.  LC encouraged deep latching with hand expression before and after to apply to nipple.   Mom showed LC left nipple and no trauma visible at this time.  Explained soreness maybe from artificial nipple use.  Baby asleep in moms arms and she reports a large formula feeding by family member recently. Mom denies other concerns at this time.  Mom to call as needed.     Patient Name: Gina Hamilton ZOXWR'UToday's Date: 08/14/2015 Reason for consult: Follow-up assessment   Maternal Data Has patient been taught Hand Expression?: Yes  Feeding Feeding Type: Formula Nipple Type: Slow - flow Length of feed: 10 min  LATCH Score/Interventions                Intervention(s): Breastfeeding basics reviewed     Lactation Tools Discussed/Used     Consult Status Consult Status: Follow-up Date: 08/15/15 Follow-up type: In-patient    [name], [name] 08/14/2015, 8:36 PM

## 2015-08-14 NOTE — Progress Notes (Signed)
Patient ID: Gina Hamilton, female   DOB: [birth year], 25 y.o.   MRN: [id number] Postpartum day one Blood pressure 140/79 pulse 78 respiration 16 Fundus firm Lochia moderate Legs negative doing well

## 2015-08-14 NOTE — Clinical Social Work Maternal (Signed)
CLINICAL SOCIAL WORK MATERNAL/CHILD NOTE  Patient Details  Name: Gina Hamilton MRN: [id number] Date of Birth: May 31, 1990  Date:  08/14/2015  Clinical Social Worker Initiating Note:  Laurey Arrow Date/ Time Initiated:  08/14/15/1020     Child's Name:  Dorita Sciara   Legal Guardian:  Mother   Need for Interpreter:  None   Date of Referral:  08/14/15     Reason for Referral:      Referral Source:  Central Nursery   Address:  [address] [place] [address]  Phone number:  [phone]   Household Members:  Self, Significant Other, Minor Children   Natural Supports (not living in the home):  Extended Family, Immediate Family, Spouse/significant other, Parent   Professional Supports: Case Metallurgist Animator at [employer])   Employment: Unemployed   Type of Work:     Education:  Database administrator Resources:  [employer]   Other Resources:  Physicist, medical    Cultural/Religious Considerations Which May Impact Care:  Per [employer] facesheet MOB is Engineer, manufacturing  Strengths:  Ability to meet basic needs , Compliance with medical plan , Home prepared for child , Understanding of illness   Risk Factors/Current Problems:  Substance Use    Cognitive State:  Linear Thinking , Alert , Insightful    Mood/Affect:  Calm , Comfortable , Interested , Relaxed    CSW Assessment:  CSW met with MOB for a consult for hx of substance use currently on Methadone.  MOB was inviting, polite, and interested in meeting with CSW.  When CSW arrived MOB had a room visitor.  MOB introduced her visitor to [place] as FOB Valli Glance). MOB gave CSW permission to meet with her in the presence of FOB.  CSW inquired about MOB's supports and preparation for her newborn.  MOB communicated that she has everything that she needs for her baby.  MOB also communicated that she feels supported by FOB, FOB's family, MOB immediate and extended family.  CSW informed MOB  of the hospital's drug screen policy, and informed MOB of the two screenings for the infant. MOB was understanding and admitted her hx of heroin.  MOB communicated that she has been on Methadone since February, and prior to Methadone, she was on Subutex.  MOB stated that due to insurance she had to make he switvch to Methadone, and she is currently being monitored by [facility].  CSW thanked MOB for being honest, and informed her that the infant had a negative UDS for as of 08/14/15. CSW also provided MOB with a brochure for [facility], and encouraged MOB to ask medical staff questions. CSW offered MOB resources and referrals for substance abuse, and MOB informed CSW that she was also receiving SA counseling at [facility].  CSW also educated MOB and FOB about PPD.  CSW informed MOB of possible supports and interventions to decrease PPD and reviewed supports for MOB. CSW also encouraged MOB to seek medical attention if needed for increased signs and symptoms for PPD. CSW reviewed safe sleep and SIDS with both parents. MOB was knowledgeable, and FOB asked appropriate questions.   MOB communicated that she has a, crib, and a car seat for the baby. MOB was attentive to the baby during the visit, and it appearbed that MOB and infant was attaching and bonding as evidence by MOB breastfeeding and engaging in infant massages.  CSW thanked MOB for allowing CSW to visit, and provided MOB with a business  card if MOB had any questions or concerns in the future.  CSW Plan/Description:  No Further Intervention Required/No Barriers to Discharge, Patient/Family Education     Dimple Nanas, LCSW 2016-02-12, 10:23 AM

## 2015-08-14 NOTE — Progress Notes (Signed)
Assumed care of mom and baby.  Baby with mom, jittery.  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 Mom encouraged to breastfeed and apply baby skin to skin as tolerated.  Mom stated understanding.

## 2015-08-15 NOTE — Progress Notes (Signed)
Patient ID: Gina Hamilton, female   DOB: 24-May-1990, 25 y.o.   MRN: [id number] Vs nl Bp129/61 Fundus firm  Home today

## 2015-08-15 NOTE — Discharge Instructions (Signed)
if  bleeding more than a period notify me  °No sex for 3 weeks °See me in 3 weeks  °Resume normal activity in am °

## 2015-08-15 NOTE — Lactation Note (Signed)
This note was copied from a baby's chart. Lactation Consultation Note Had 7% weight loss, breast and formula. Had 8 voids and 5 stools. Supplementing w/formula 7-5122ml.  [facility]  Patient Name: Gina Hamilton'UToday's Date: 08/15/2015 Reason for consult: Follow-up assessment;Infant weight loss   Maternal Data    Feeding Feeding Type: Breast Fed Nipple Type: Slow - flow  LATCH Score/Interventions                      Lactation Tools Discussed/Used     Consult Status Consult Status: Follow-up Date: 08/16/15 Follow-up type: In-patient    Charyl DancerCARVER, Suhaas Agena G 08/15/2015, 4:42 AM

## 2015-08-15 NOTE — Lactation Note (Signed)
This note was copied from a baby's chart. Lactation Consultation Note  Patient Name: Gina Hamilton Reason for consult: Follow-up assessment  Baby 53 hours old. Mom reports that she just attempted to latch baby and baby sleepy at breast. Mom also reports more difficulty latching baby to left breast. Enc mom to undress baby while nursing, and demonstrated ways to stimulate baby to keep nursing. Mom able to hand express with colostrum present from left breast. Assisted mom to latch baby to left breast in football position. Baby latched deeply and suckled rhythmically with intermittent swallows noted. Demonstrated how to flange baby's lower lip and mom reported increased comfort. Baby maintained a deep latch, and continued to have good swallows, but had to stimulate baby often to continue suckling. Enc mom to keep baby on breast as long as baby actively nursing--even if baby has to be stimulated to continue. Enc mom to switch breasts if baby still cueing after baby has nursed on one side. Enc mom to offer lots of STS and nurse often. Enc mom to allow FOB to be her "spotter" while she is nursing, d/t mom's sleepiness.   Maternal Data    Feeding Feeding Type: Breast Fed Length of feed:  (LC assessed first 10 minutes of BF. )  LATCH Score/Interventions Latch: Grasps breast easily, tongue down, lips flanged, rhythmical sucking. Intervention(s): Adjust position;Assist with latch  Audible Swallowing: Spontaneous and intermittent  Type of Nipple: Everted at rest and after stimulation  Comfort (Breast/Nipple): Filling, red/small blisters or bruises, mild/mod discomfort  Problem noted: Mild/Moderate discomfort  Hold (Positioning): Assistance needed to correctly position infant at breast and maintain latch. Intervention(s): Breastfeeding basics reviewed;Support Pillows;Position options;Skin to skin  LATCH Score: 8  Lactation Tools Discussed/Used     Consult  Status Consult Status: Follow-up Date: 08/16/15 Follow-up type: In-patient    Geralynn OchsWILLIARD, Cem Kosman Hamilton, 11:40 AM

## 2015-08-15 NOTE — Discharge Summary (Signed)
Obstetric Discharge Summary Reason for Admission: onset of labor Prenatal Procedures: none Intrapartum Procedures: spontaneous vaginal delivery Postpartum Procedures: none Complications-Operative and Postpartum: none HEMOGLOBIN  Date Value Ref Range Status  08/14/2015 10.4* 12.0 - 15.0 g/dL Final   HCT  Date Value Ref Range Status  08/14/2015 32.5* 36.0 - 46.0 % Final    Physical Exam:  General: alert Lochia: appropriate Uterine Fundus: firm Incision: healing well DVT Evaluation: No evidence of DVT seen on physical exam.  Discharge Diagnoses: Term Pregnancy-delivered  Discharge Information: Date: 08/15/2015 Activity: pelvic rest Diet: routine Medications: Ibuprofen Condition: improved Instructions: refer to practice specific booklet Discharge to: home Follow-up Information    Follow up with HARPER,CHARLES A, MD.   Specialty:  Obstetrics and Gynecology   Contact information:   [address] [place] [address] [phone]       Newborn Data: Live born female  Birth Weight: 6 lb 4.9 oz (2860 g) APGAR: 9, 10  Home with mother.  Jahne Krukowski A 08/15/2015, 6:28 AM

## 2015-08-16 ENCOUNTER — Ambulatory Visit: Payer: Self-pay

## 2015-08-16 NOTE — Lactation Note (Signed)
This note was copied from a baby's chart. Lactation Consultation Note Follow up visit at 84 hours of age.  Baby has had 9 bottle feedings, 3 breast feedings, 7 voids and 1 stool in past 24 hours.  MOm already has baby latched.  Mom reports ice on breast and using DEBP prior to feeding.  Mom reports her breasts are getting more full.  Discussed breast softening prior to latching.  Baby needed stimulation to maintain feeding with swallows noted.  LC assisted with cross cradle hold to relatch baby deeply.  Mom was not independent with latch.  LC encouraged mom with efforts to breastfeed.  Encouraged mom to keep record of all feedings and diaper changes.  MOm denies further concerns at this time.  Mom to call for assist as needed.        Patient Name: Gina Hamilton's Date: 08/16/2015 Reason for consult: Follow-up assessment   Maternal Data    Feeding Feeding Type: Breast Fed Nipple Type: Slow - flow Length of feed:  (15 minutes about half observed)  LATCH Score/Interventions Latch: Grasps breast easily, tongue down, lips flanged, rhythmical sucking. Intervention(s): Adjust position;Assist with latch;Breast massage;Breast compression  Audible Swallowing: A few with stimulation Intervention(s): Skin to skin;Hand expression;Alternate breast massage  Type of Nipple: Everted at rest and after stimulation  Comfort (Breast/Nipple): Soft / non-tender  Problem noted: Cracked, bleeding, blisters, bruises;Mild/Moderate discomfort Interventions  (Cracked/bleeding/bruising/blister): Double electric pump;Expressed breast milk to nipple Interventions (Mild/moderate discomfort): Hand massage;Hand expression;Post-pump  Hold (Positioning): Assistance needed to correctly position infant at breast and maintain latch. Intervention(s): Breastfeeding basics reviewed;Support Pillows;Position options  LATCH Score: 8  Lactation Tools Discussed/Used Tools: Pump Breast pump type: Double-Electric Breast  Pump   Consult Status Consult Status: Follow-up Date: 08/17/15 Follow-up type: In-patient    Sherrice Creekmore, [name] 08/16/2015, 6:11 PM

## 2015-08-17 ENCOUNTER — Ambulatory Visit: Payer: Self-pay

## 2015-08-17 NOTE — Lactation Note (Signed)
This note was copied from a baby's chart. Lactation Consultation Note  Patient Name: Gina Hamilton ZOXWR'UToday's Date: 08/17/2015   Baby 174 days old, and getting both formula and breast milk by bottle.  Mom puts baby on the breast periodically, latch scores 7.  Encouraged her to ask for help as needed.  Recommended that she pump both breasts 15-20 minutes on regular setting as she obtained 35 ml at last pumping.  To add formula to breast milk as needed for a total of 60 ml at each feeding now.  Breasts are filling now.  Mom to increase her pumping if she is choosing to offer baby bottles.  Recommended she pump at every feeding or >8 times in 24 hrs.  Baby sleeping in his crib now, and Mom getting ready to pump.  Mom to ask for help and Lactation to follow up in am.    Gina Hamilton, Gina Hamilton 08/17/2015, 3:36 PM

## 2015-08-17 NOTE — Progress Notes (Signed)
CSW met with MOB regarding a request from bedside nurse (Bessie) in reference to lack of transportation for her methadone appointment.  MOB was inviting, polite, and was interested in meeting with CSW.  MOB expressed that she became upset this morning because her mother was late picking MOB up for her Methadone appointment. CSW processed with MOB transportation for MOB for tomorrow (6/23).  CSW offered MOB bus passes for tomorrow, however MOB declined, and communicated she will call CSW by 8:30am, if bus passes are needed. MOB is relying on MOB's mother to transport MOB to her next appointment.

## 2015-08-18 ENCOUNTER — Ambulatory Visit: Payer: Self-pay

## 2015-08-18 NOTE — Lactation Note (Signed)
This note was copied from a baby's chart. Lactation Consultation Note  Patient Name: Gina Hamilton ZOXWR'UToday's Date: 08/18/2015 Reason for consult: Follow-up assessment Baby at 5 days of life. Mom is reporting bilateral nipple soreness. The R nipple has a thin horizontal crack at the top of the nipple surface. Given comfort gels and reviewed nipple care. Mom is latching, pumping, and offering formula. Discussed baby behavior, feeding frequency, baby belly size, and breast changes. Answered questions about nursing bras. She is aware of lactation services and support group. She will call as needed.     Maternal Data    Feeding Feeding Type: Breast Fed  LATCH Score/Interventions Latch: Repeated attempts needed to sustain latch, nipple held in mouth throughout feeding, stimulation needed to elicit sucking reflex. Intervention(s): Adjust position;Assist with latch  Audible Swallowing: Spontaneous and intermittent Intervention(s): Alternate breast massage  Type of Nipple: Everted at rest and after stimulation  Comfort (Breast/Nipple): Filling, red/small blisters or bruises, mild/mod discomfort  Problem noted: Cracked, bleeding, blisters, bruises;Mild/Moderate discomfort Interventions  (Cracked/bleeding/bruising/blister): Expressed breast milk to nipple Interventions (Mild/moderate discomfort): Comfort gels  Hold (Positioning): No assistance needed to correctly position infant at breast. Intervention(s): Skin to skin;Position options;Support Pillows  LATCH Score: 8  Lactation Tools Discussed/Used     Consult Status Consult Status: Follow-up Date: 08/19/15 Follow-up type: In-patient    Gina Hamilton 08/18/2015, 2:46 PM

## 2016-11-23 IMAGING — CR DG CHEST 2V
2 series · 2 of 2 positions shown · non-contrast
Comparison: 07/18/2015

CLINICAL DATA: Cough and congestion for 2 weeks.

EXAM:
CHEST  2 VIEW

[chest pa]
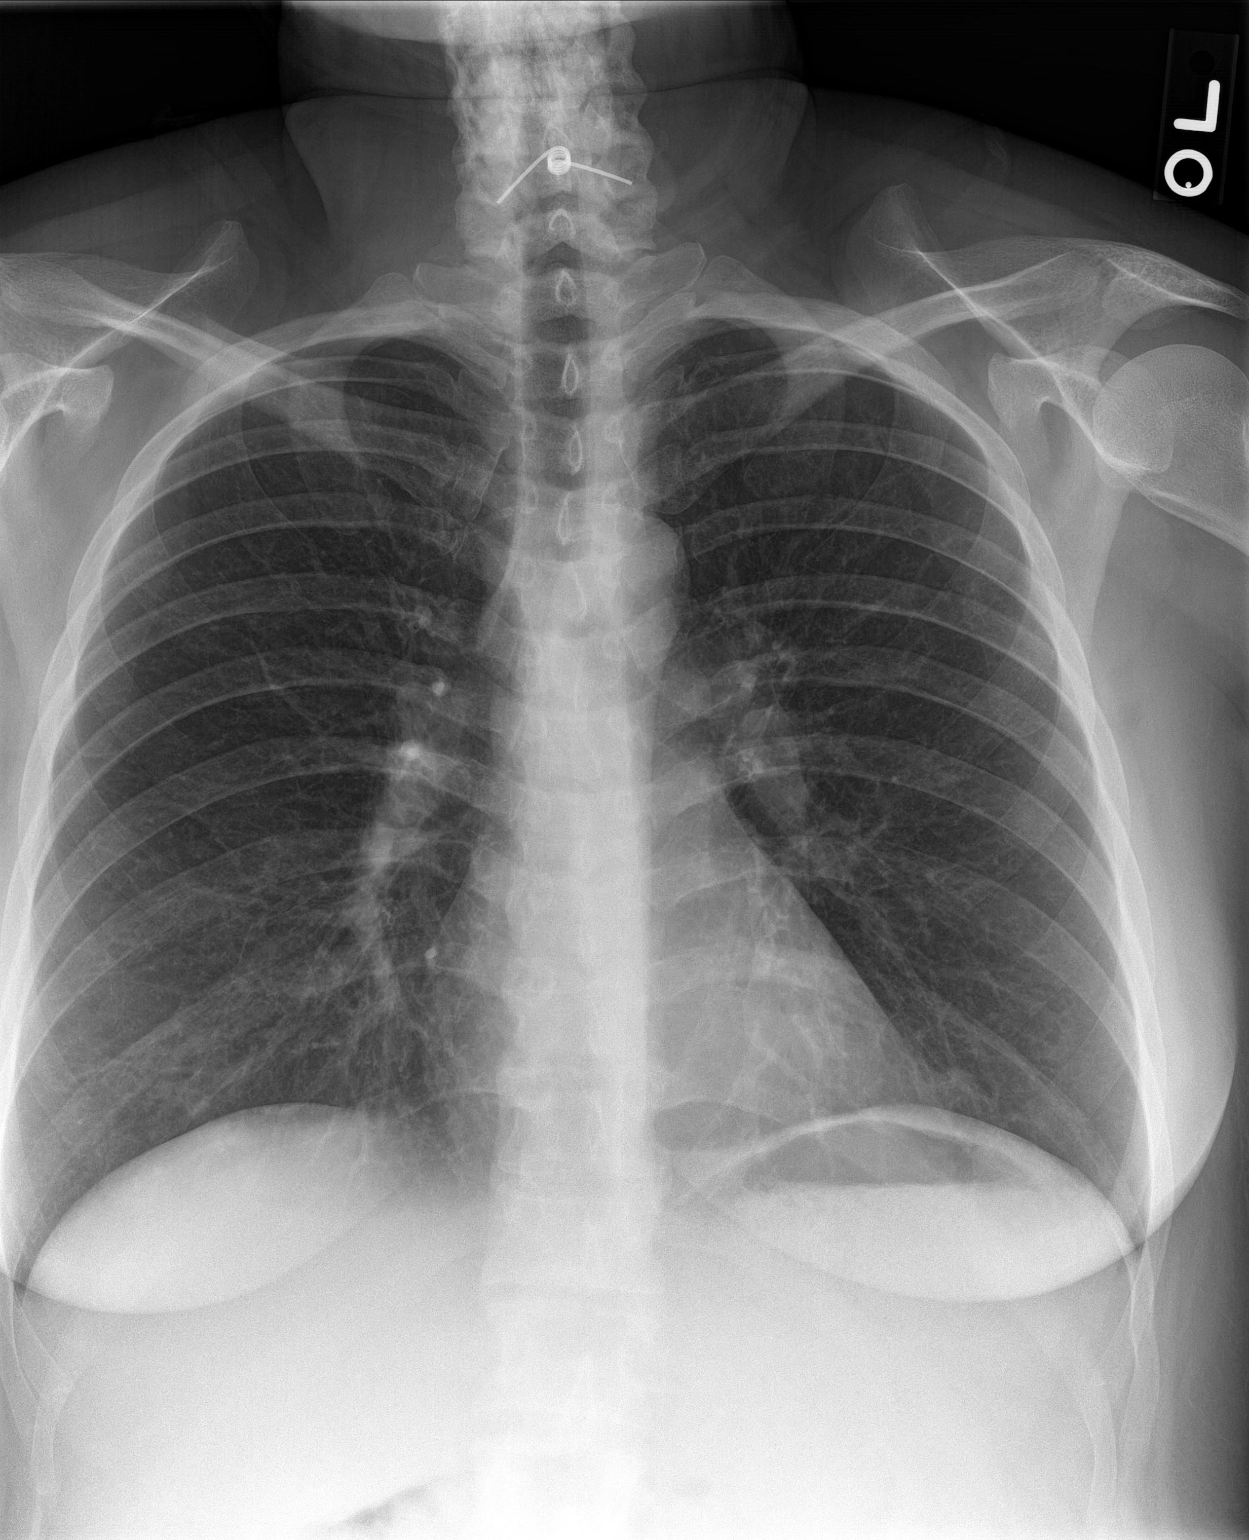

[chest lat]
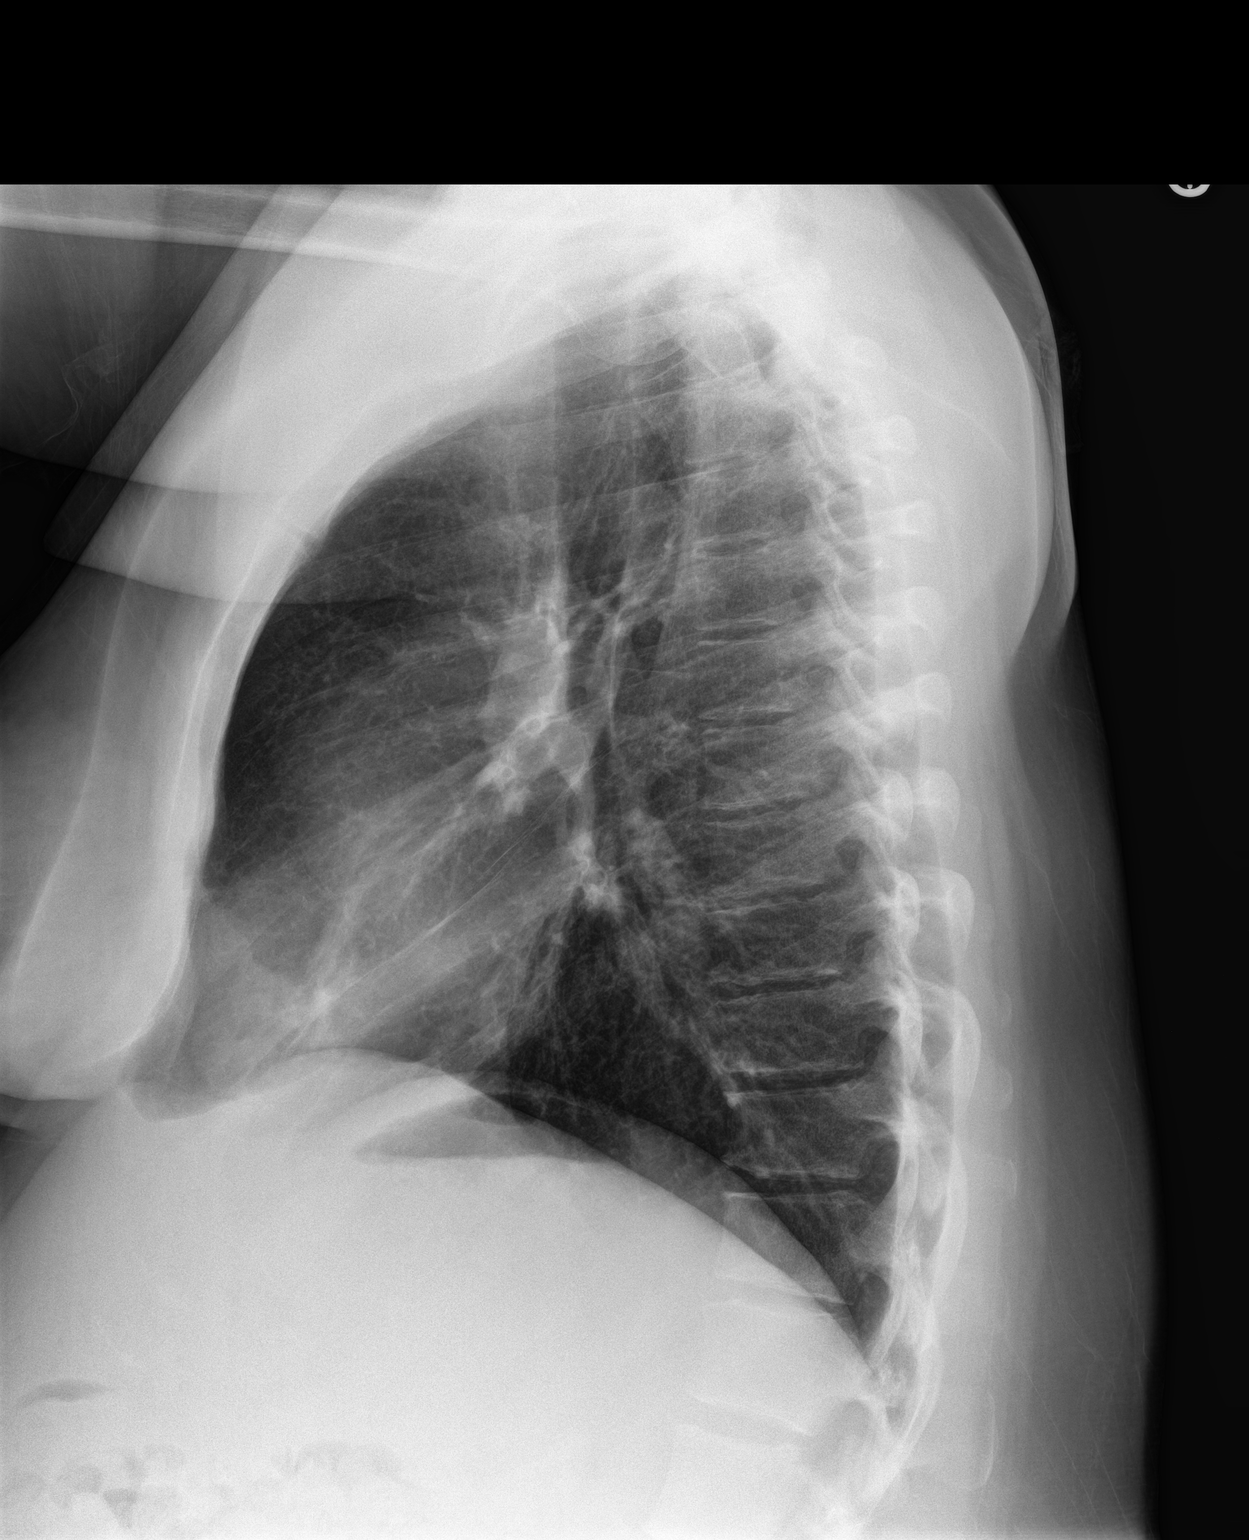

[2 of 2 positions shown; findings below may reference images not displayed]

FINDINGS: The heart size and mediastinal contours are within normal limits.
Both lungs are clear. The visualized skeletal structures are
unremarkable.
IMPRESSION: No active cardiopulmonary disease.
